# Patient Record
Sex: Female | Born: 2000 | Hispanic: No | Marital: Single | Smoking: Never smoker
Health system: Southern US, Community
[De-identification: ages and names within clinical notes are randomized; demographics above are authoritative.]

## PROBLEM LIST (undated history)

## (undated) DIAGNOSIS — J45909 Unspecified asthma, uncomplicated: Secondary | ICD-10-CM

---
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
## 2021-10-03 ENCOUNTER — Ambulatory Visit
Admission: RE | Admit: 2021-10-03 | Discharge: 2021-10-03 | Disposition: A | Discharge status: Home / Self Care | Payer: Commercial Managed Care - PPO | Source: Ambulatory Visit | Attending: Family Medicine | Admitting: Family Medicine

## 2021-10-03 ENCOUNTER — Other Ambulatory Visit: Payer: Self-pay | Admitting: Family Medicine

## 2021-10-03 DIAGNOSIS — R609 Edema, unspecified: Secondary | ICD-10-CM
